# Patient Record
Sex: Male | Born: 2000
Health system: Southern US, Community
[De-identification: ages and names within clinical notes are randomized; demographics above are authoritative.]

---

## 2019-07-09 ENCOUNTER — Emergency Department (HOSPITAL_BASED_OUTPATIENT_CLINIC_OR_DEPARTMENT_OTHER)
Admission: EM | Admit: 2019-07-09 | Discharge: 2019-07-09 | Disposition: A | Discharge status: Home / Self Care | Payer: Self-pay | Attending: Emergency Medicine | Admitting: Emergency Medicine

## 2019-07-09 ENCOUNTER — Emergency Department (HOSPITAL_BASED_OUTPATIENT_CLINIC_OR_DEPARTMENT_OTHER): Payer: Self-pay

## 2019-07-09 ENCOUNTER — Other Ambulatory Visit: Payer: Self-pay

## 2019-07-09 ENCOUNTER — Encounter (HOSPITAL_BASED_OUTPATIENT_CLINIC_OR_DEPARTMENT_OTHER): Payer: Self-pay | Admitting: Emergency Medicine

## 2019-07-09 DIAGNOSIS — M545 Low back pain, unspecified: Secondary | ICD-10-CM

## 2019-07-09 DIAGNOSIS — M47896 Other spondylosis, lumbar region: Secondary | ICD-10-CM | POA: Insufficient documentation

## 2019-07-09 DIAGNOSIS — M4306 Spondylolysis, lumbar region: Secondary | ICD-10-CM

## 2019-07-09 MED ORDER — CYCLOBENZAPRINE HCL 10 MG PO TABS
10.0000 mg | ORAL_TABLET | Freq: Once | ORAL | Status: AC
  Administered 2019-07-09: 10:00:00 10 mg via ORAL
  Filled 2019-07-09: qty 1

## 2019-07-09 MED ORDER — CYCLOBENZAPRINE HCL 10 MG PO TABS: 10.0000 mg | ORAL_TABLET | Freq: Two times a day (BID) | ORAL | 0 refills | Status: AC | PRN

## 2019-07-09 MED ORDER — LIDOCAINE 5 % EX PTCH: 1.0000 | MEDICATED_PATCH | CUTANEOUS | 0 refills | Status: AC

## 2019-07-09 MED FILL — CYCLOBENZAPRINE HCL 10 MG T: 10 | 10 days supply | Qty: 20 | Fill #0

## 2019-07-09 NOTE — ED Triage Notes (Signed)
Central low back pain for a couple weeks.  Job includes lifting heavy objects. No one specific injury.  Pain getting worse.

## 2019-07-09 NOTE — Discharge Instructions (Signed)
Your work-up today did not show significant evidence of spinal fracture but did show evidence of the degenerative disease in your low back.  Your exam was consistent with muscle spasms and muscle pains.  Please use the muscle relaxant medication and the numbing patch to help with your symptoms and please follow-up with the spine team.  Please rest and use her back brace with anti-inflammatory medication.  If any symptoms change or worsen, please return to nearest emergency department.

## 2019-07-09 NOTE — ED Provider Notes (Signed)
[facility] EMERGENCY DEPARTMENT Provider Note   CSN: [id number] Arrival date & time: 07/09/19  0809     History   Chief Complaint Chief Complaint  Patient presents with  . Back Pain    HPI Neal DyGabriel Neer is a 18 y.o. male.     The history is provided by the patient, medical records and the nursing home.  Back Pain Location:  Lumbar spine Quality:  Aching and cramping Radiates to:  Does not radiate Pain severity:  Severe Pain is:  Same all the time Onset quality:  Gradual Duration:  3 weeks Timing:  Constant Progression:  Waxing and waning Chronicity:  New Relieved by:  Nothing Worsened by:  Movement Ineffective treatments:  Ibuprofen and heating pad Associated symptoms: no abdominal pain, no bladder incontinence, no bowel incontinence, no chest pain, no dysuria, no fever, no headaches, no leg pain, no numbness, no paresthesias, no perianal numbness and no weakness     History reviewed. No pertinent past medical history.  There are no active problems to display for this patient.   History reviewed. No pertinent surgical history.      Home Medications    Prior to Admission medications   Not on File    Family History No family history on file.  Social History Social History   Tobacco Use  . Smoking status: Never Smoker  . Smokeless tobacco: Never Used  Substance Use Topics  . Alcohol use: Yes    Comment: occ  . Drug use: Never     Allergies   Patient has no known allergies.   Review of Systems Review of Systems  Constitutional: Negative for chills, diaphoresis, fatigue and fever.  HENT: Negative for congestion.   Respiratory: Negative for cough, chest tightness, shortness of breath, wheezing and stridor.   Cardiovascular: Negative for chest pain, palpitations and leg swelling.  Gastrointestinal: Negative for abdominal pain, bowel incontinence, constipation, diarrhea, nausea and vomiting.  Genitourinary: Negative for bladder  incontinence, decreased urine volume, dysuria, flank pain and frequency.  Musculoskeletal: Positive for back pain. Negative for neck pain and neck stiffness.  Skin: Negative for rash and wound.  Neurological: Negative for weakness, light-headedness, numbness, headaches and paresthesias.  Psychiatric/Behavioral: Negative for agitation.  All other systems reviewed and are negative.    Physical Exam Updated Vital Signs BP 130/85 (BP Location: Right Arm)   Pulse (!) 57   Temp 98.4 F (36.9 C) (Oral)   Resp 16   Ht 5\' 6"  (1.676 m)   Wt 97 kg   SpO2 100%   BMI 34.52 kg/m   Physical Exam Vitals signs and nursing note reviewed.  Constitutional:      General: He is not in acute distress.    Appearance: He is well-developed. He is not ill-appearing, toxic-appearing or diaphoretic.  HENT:     Head: Normocephalic and atraumatic.     Nose: No congestion or rhinorrhea.  Eyes:     Conjunctiva/sclera: Conjunctivae normal.  Neck:     Musculoskeletal: Neck supple. No muscular tenderness.  Cardiovascular:     Rate and Rhythm: Normal rate and regular rhythm.     Pulses: Normal pulses.     Heart sounds: No murmur.  Pulmonary:     Effort: Pulmonary effort is normal. No respiratory distress.     Breath sounds: Normal breath sounds. No wheezing, rhonchi or rales.  Chest:     Chest wall: No tenderness.  Abdominal:     General: Abdomen is flat.  Palpations: Abdomen is soft.     Tenderness: There is no abdominal tenderness. There is no right CVA tenderness, left CVA tenderness, guarding or rebound.  Musculoskeletal:        General: Tenderness present.     Lumbar back: He exhibits tenderness, pain and spasm.       Back:     Right lower leg: No edema.     Left lower leg: No edema.  Skin:    General: Skin is warm and dry.     Capillary Refill: Capillary refill takes less than 2 seconds.  Neurological:     General: No focal deficit present.     Mental Status: He is alert and oriented  to person, place, and time.     Sensory: No sensory deficit.     Motor: No weakness.  Psychiatric:        Mood and Affect: Mood normal.      ED Treatments / Results  Labs (all labs ordered are listed, but only abnormal results are displayed) Labs Reviewed - No data to display  EKG None  Radiology Dg Lumbar Spine Complete  Result Date: 07/09/2019 CLINICAL DATA:  Three weeks of severe midline and RIGHT-sided back pain, does heavy lifting at work EXAM: LUMBAR SPINE - COMPLETE 4+ VIEW COMPARISON:  None FINDINGS: Hypoplastic last rib pair. Five non-rib-bearing lumbar vertebra. Osseous mineralization normal. Vertebral body and disc space heights maintained. No fracture, subluxation, or bone destruction. Lateral view suggests at least unilateral and suspected BILATERAL spondylolysis of L5, suboptimally visualized on shallow oblique views. SI joints preserved. IMPRESSION: Suspected BILATERAL spondylolysis of L5 without spondylolisthesis. Remainder of exam unremarkable. Electronically Signed   By: Ulyses Southward M.D.   On: 07/09/2019 09:33    Procedures Procedures (including critical care time)  Medications Ordered in ED Medications  cyclobenzaprine (FLEXERIL) tablet 10 mg (10 mg Oral Given 07/09/19 1017)     Initial Impression / Assessment and Plan / ED Course  I have reviewed the triage vital signs and the nursing notes.  Pertinent labs & imaging results that were available during my care of the patient were reviewed by me and considered in my medical decision making (see chart for details).        Duilio Heritage is a 18 y.o. male with no significant past medical history who presents with low back pain.  Patient reports that for the last 3 weeks he has been having pain in his mid low back and right low back.  He says that he does lots of heavy lifting at work and thinks this may have aggravated it.  He denies any specific injury.  He denies any loss of bowel or bladder control.  Denies  pain radiating into his legs.  No numbness, tingling, or weakness of legs.  No gait problem.  Denies other injuries.  Reports he is been using anti-inflammatory medications, heat pads, and a back brace.  The heat has helped slightly but he is continued to have severe pain today he reports his pain was so bad he felt like he could not get around.  He denies any urinary symptoms, GI symptoms, or history of kidney stones.  No fevers, chills, or other infectious symptoms.  No history of back surgeries or prior back injury.  On exam, patient does have some mild tenderness in the midline low back and the right low back.  Muscle spasm palpated in the right low back.  No CVA tenderness.  Lungs clear and chest and  abdomen nontender.  Normal strength and sensation in legs.  Good pulses in legs.  Clinical aspect patient has musculoskeletal back pain from his job and the lifting he does at work.  As patient is never had any history of this and has been having pain for 3 weeks despite bracing and anti-inflammatories, will get x-ray to look for bony abnormality.  With lack of radicular symptoms, low suspicion for cauda equina or injuries that require CT at this time.  If x-ray is reassuring, anticipate discharge with prescription for muscle relaxant and Lidoderm patch.  Anticipate reassessment  10:16 AM X-rays showed bilateral spondylolysis with no spondylolisthesis.  Patient will be given prescription for Lidoderm patch and Flexeril for the muscle spasm and instructed to follow-up with the spine team.  He is also instructed to use his brace, rest his back, and continue taking anti-inflammatory medication.  Patient agreed with plan of care, follow-up instructions, and return precautions for spinal injury red flags.  He no questions or concerns and was discharged in good condition.   Final Clinical Impressions(s) / ED Diagnoses   Final diagnoses:  Acute right-sided low back pain without sciatica  Spondylolysis  of lumbar region    ED Discharge Orders         Ordered    lidocaine (LIDODERM) 5 %  Every 24 hours     07/09/19 1019    cyclobenzaprine (FLEXERIL) 10 MG tablet  2 times daily PRN     07/09/19 1019          Clinical Impression: 1. Acute right-sided low back pain without sciatica   2. Spondylolysis of lumbar region     Disposition: Discharge  Condition: Good  I have discussed the results, Dx and Tx plan with the pt(& family if present). He/she/they expressed understanding and agree(s) with the plan. Discharge instructions discussed at great length. Strict return precautions discussed and pt &/or family have verbalized understanding of the instructions. No further questions at time of discharge.    New Prescriptions   CYCLOBENZAPRINE (FLEXERIL) 10 MG TABLET    Take 1 tablet (10 mg total) by mouth 2 (two) times daily as needed.   LIDOCAINE (LIDODERM) 5 %    Place 1 patch onto the skin daily. Remove & Discard patch within 12 hours or as directed by MD    Follow Up: [facility], [place] [address] [place] [address] [phone]        Hasna Stefanik, Gwenyth Allegra, MD 07/09/19 1021

## 2020-09-21 IMAGING — CR DG LUMBAR SPINE COMPLETE 4+V
5 series · 5 of 5 positions shown · non-contrast
Comparison: None

CLINICAL DATA: Three weeks of severe midline and RIGHT-sided back
pain, does heavy lifting at work

EXAM:
LUMBAR SPINE - COMPLETE 4+ VIEW

[t l-spine a.p.]
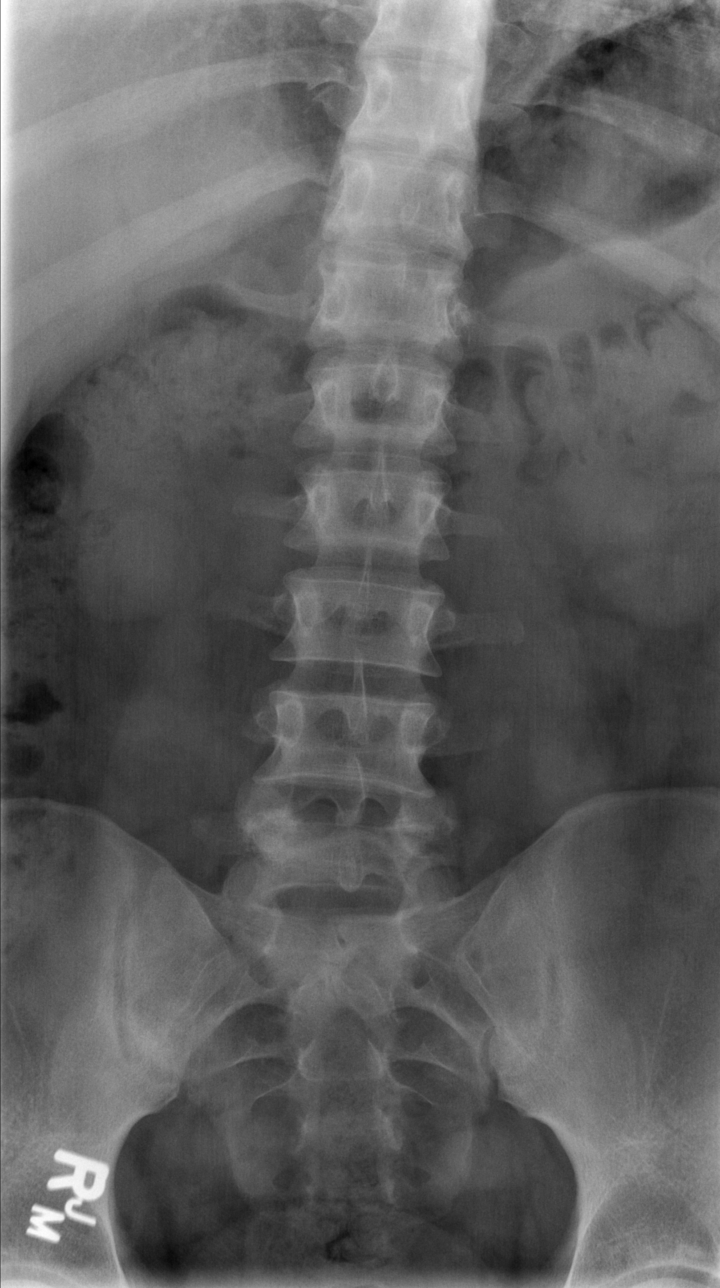

[t l-spine oblique exposure (1 of 2)]
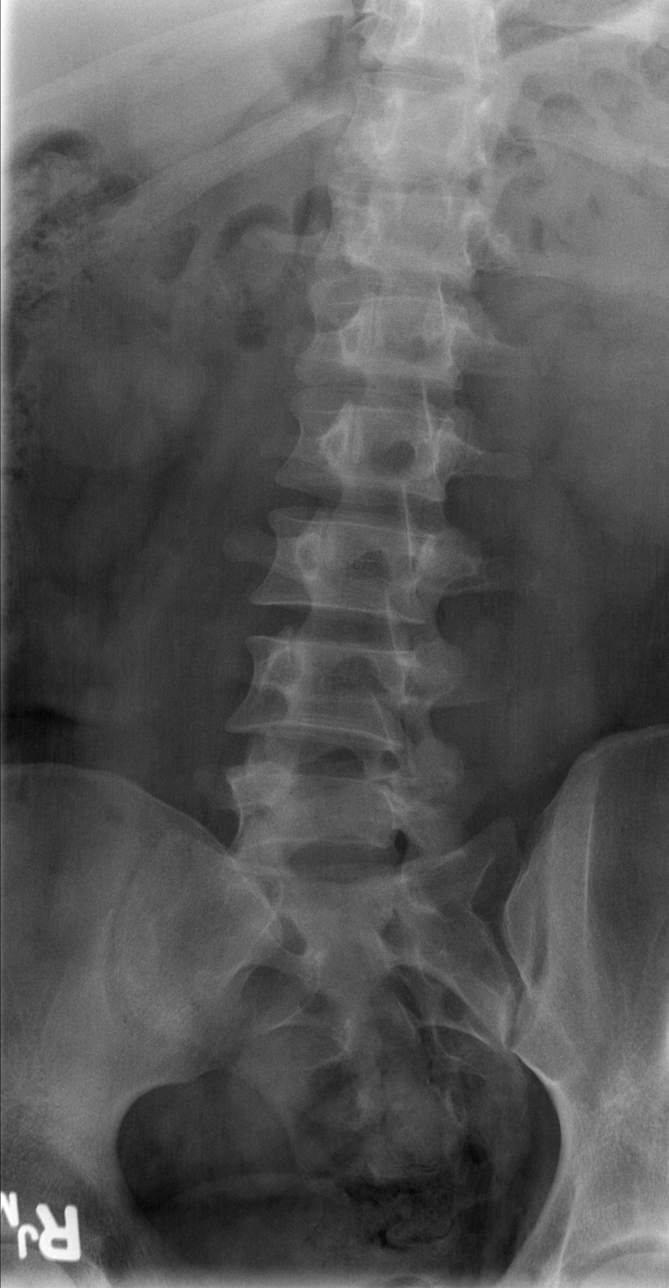

[t l-spine oblique exposure (2 of 2)]
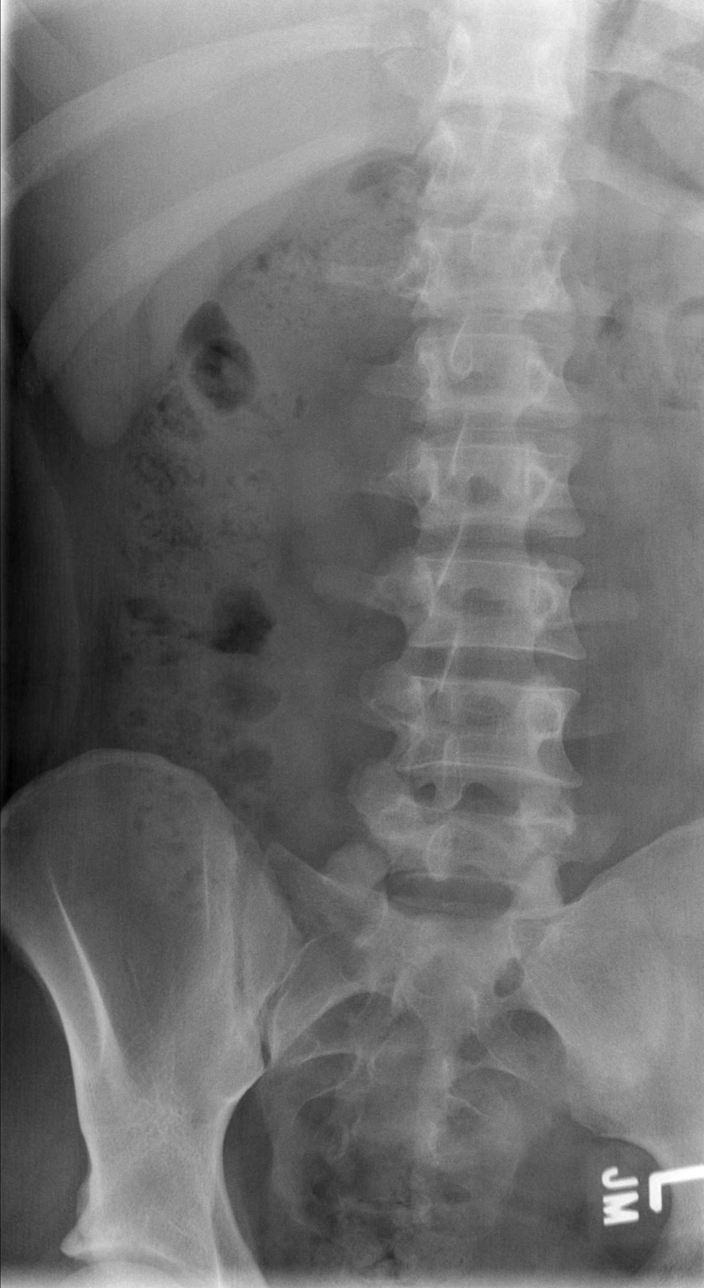

[t l-spine lat]
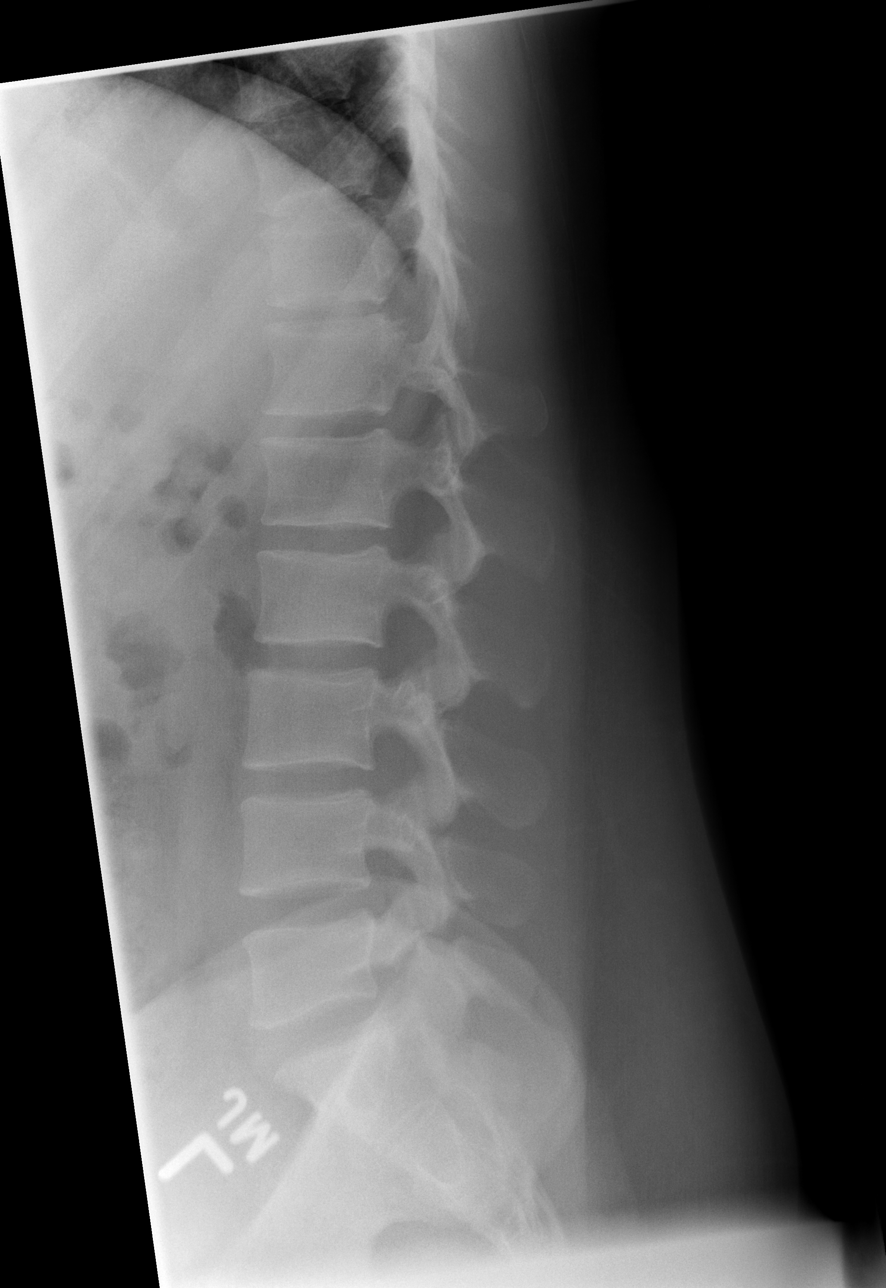

[t l-spine l5-s1 spot]
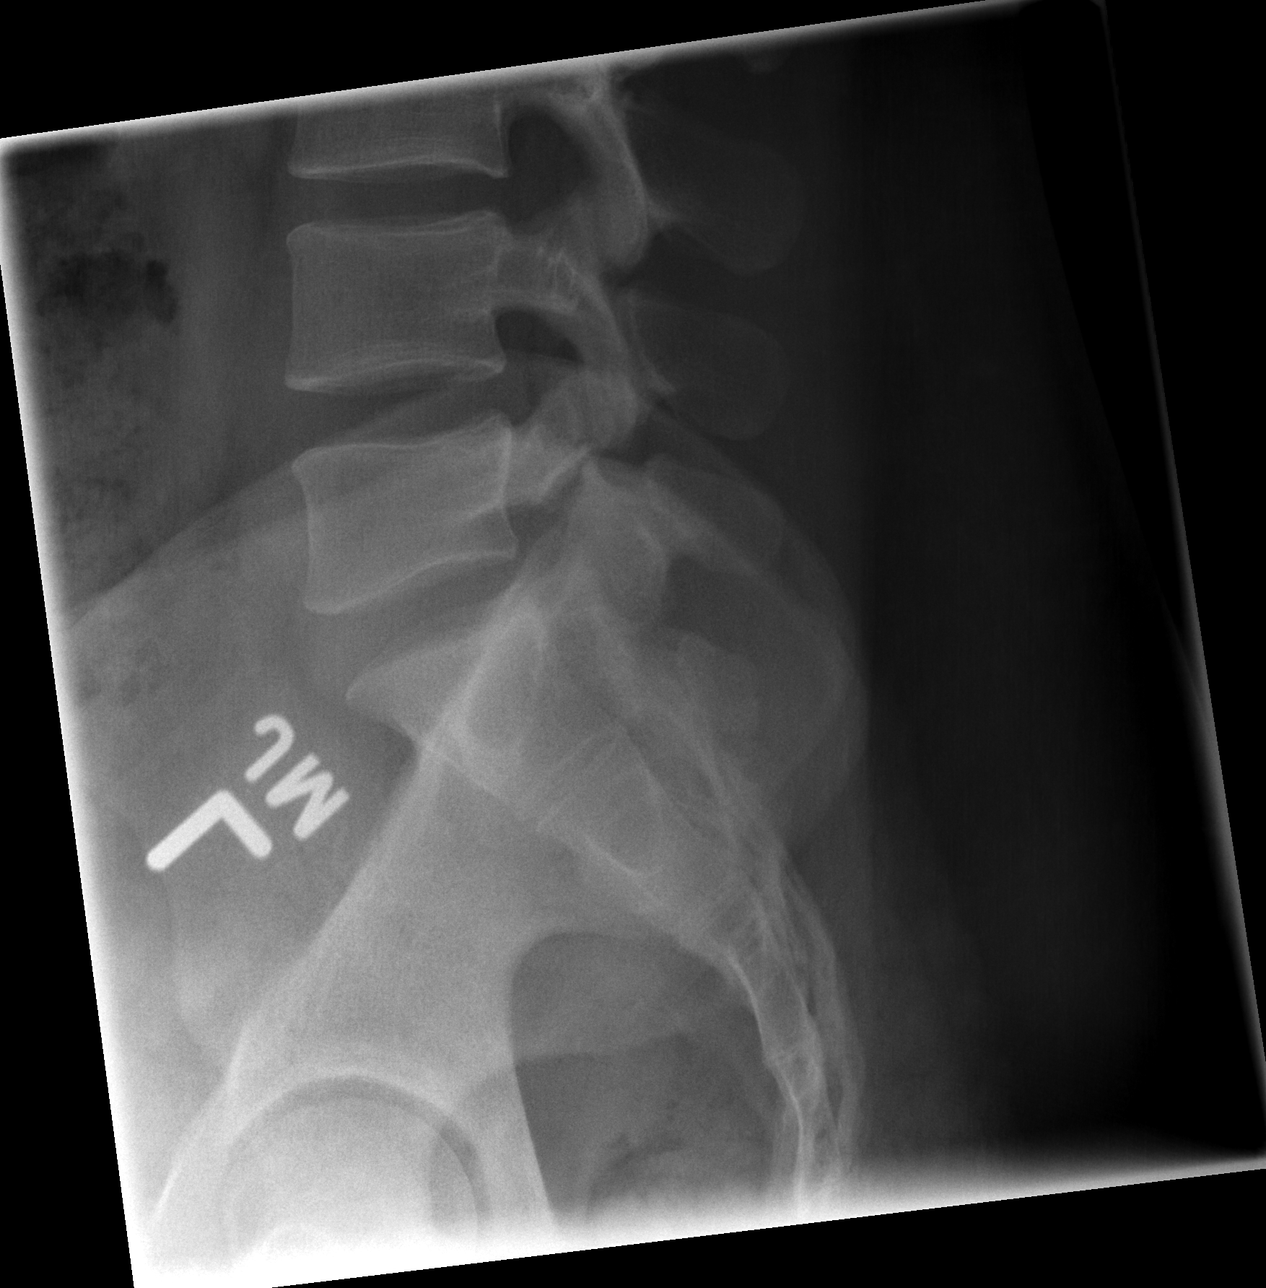

[5 of 5 positions shown; findings below may reference images not displayed]

FINDINGS: Hypoplastic last rib pair.

Five non-rib-bearing lumbar vertebra.

Osseous mineralization normal.

Vertebral body and disc space heights maintained.

No fracture, subluxation, or bone destruction.

Lateral view suggests at least unilateral and suspected BILATERAL
spondylolysis of L5, suboptimally visualized on shallow oblique
views.

SI joints preserved.
IMPRESSION: Suspected BILATERAL spondylolysis of L5 without spondylolisthesis.

Remainder of exam unremarkable.
